# Patient Record
Sex: Female | Born: 1935 | Race: Black or African American | Hispanic: No | Marital: Single | Smoking: Never smoker
Health system: Southern US, Community
[De-identification: ages and names within clinical notes are randomized; demographics above are authoritative.]

## PROBLEM LIST (undated history)

## (undated) DIAGNOSIS — I10 Essential (primary) hypertension: Secondary | ICD-10-CM

## (undated) DIAGNOSIS — J449 Chronic obstructive pulmonary disease, unspecified: Secondary | ICD-10-CM

## (undated) DIAGNOSIS — K5790 Diverticulosis of intestine, part unspecified, without perforation or abscess without bleeding: Secondary | ICD-10-CM

## (undated) DIAGNOSIS — D869 Sarcoidosis, unspecified: Secondary | ICD-10-CM

## (undated) DIAGNOSIS — J45909 Unspecified asthma, uncomplicated: Secondary | ICD-10-CM

## (undated) HISTORY — PX: EYE SURGERY: SHX253

## (undated) HISTORY — PX: HERNIA REPAIR: SHX51

## (undated) HISTORY — PX: KNEE SURGERY: SHX244

---

## 2015-09-27 ENCOUNTER — Encounter: Payer: Self-pay | Admitting: Emergency Medicine

## 2015-09-27 ENCOUNTER — Observation Stay
Admission: EM | Admit: 2015-09-27 | Discharge: 2015-09-28 | Disposition: A | Discharge status: Home / Self Care | Payer: Medicare Other | Attending: Internal Medicine | Admitting: Internal Medicine

## 2015-09-27 DIAGNOSIS — R42 Dizziness and giddiness: Secondary | ICD-10-CM | POA: Diagnosis not present

## 2015-09-27 DIAGNOSIS — I495 Sick sinus syndrome: Secondary | ICD-10-CM | POA: Diagnosis present

## 2015-09-27 DIAGNOSIS — Z8589 Personal history of malignant neoplasm of other organs and systems: Secondary | ICD-10-CM | POA: Insufficient documentation

## 2015-09-27 DIAGNOSIS — I10 Essential (primary) hypertension: Secondary | ICD-10-CM | POA: Insufficient documentation

## 2015-09-27 DIAGNOSIS — K859 Acute pancreatitis, unspecified: Secondary | ICD-10-CM

## 2015-09-27 DIAGNOSIS — K429 Umbilical hernia without obstruction or gangrene: Secondary | ICD-10-CM | POA: Insufficient documentation

## 2015-09-27 DIAGNOSIS — Z9849 Cataract extraction status, unspecified eye: Secondary | ICD-10-CM | POA: Insufficient documentation

## 2015-09-27 DIAGNOSIS — Z9981 Dependence on supplemental oxygen: Secondary | ICD-10-CM | POA: Insufficient documentation

## 2015-09-27 DIAGNOSIS — N83201 Unspecified ovarian cyst, right side: Secondary | ICD-10-CM | POA: Insufficient documentation

## 2015-09-27 DIAGNOSIS — K5730 Diverticulosis of large intestine without perforation or abscess without bleeding: Secondary | ICD-10-CM | POA: Insufficient documentation

## 2015-09-27 DIAGNOSIS — J45909 Unspecified asthma, uncomplicated: Secondary | ICD-10-CM | POA: Diagnosis not present

## 2015-09-27 DIAGNOSIS — R000 Tachycardia, unspecified: Secondary | ICD-10-CM | POA: Diagnosis not present

## 2015-09-27 DIAGNOSIS — R55 Syncope and collapse: Secondary | ICD-10-CM | POA: Insufficient documentation

## 2015-09-27 DIAGNOSIS — R1084 Generalized abdominal pain: Secondary | ICD-10-CM | POA: Diagnosis present

## 2015-09-27 DIAGNOSIS — Z79899 Other long term (current) drug therapy: Secondary | ICD-10-CM | POA: Insufficient documentation

## 2015-09-27 DIAGNOSIS — R1032 Left lower quadrant pain: Secondary | ICD-10-CM | POA: Insufficient documentation

## 2015-09-27 DIAGNOSIS — Z7951 Long term (current) use of inhaled steroids: Secondary | ICD-10-CM | POA: Diagnosis not present

## 2015-09-27 DIAGNOSIS — Z780 Asymptomatic menopausal state: Secondary | ICD-10-CM | POA: Diagnosis not present

## 2015-09-27 DIAGNOSIS — I493 Ventricular premature depolarization: Secondary | ICD-10-CM | POA: Insufficient documentation

## 2015-09-27 DIAGNOSIS — R109 Unspecified abdominal pain: Secondary | ICD-10-CM | POA: Diagnosis present

## 2015-09-27 DIAGNOSIS — J449 Chronic obstructive pulmonary disease, unspecified: Secondary | ICD-10-CM | POA: Diagnosis not present

## 2015-09-27 DIAGNOSIS — N83209 Unspecified ovarian cyst, unspecified side: Secondary | ICD-10-CM

## 2015-09-27 HISTORY — DX: Chronic obstructive pulmonary disease, unspecified: J44.9

## 2015-09-27 HISTORY — DX: Diverticulosis of intestine, part unspecified, without perforation or abscess without bleeding: K57.90

## 2015-09-27 HISTORY — DX: Unspecified asthma, uncomplicated: J45.909

## 2015-09-27 HISTORY — DX: Essential (primary) hypertension: I10

## 2015-09-27 HISTORY — DX: Sarcoidosis, unspecified: D86.9

## 2015-09-27 LAB — CBC
HCT: 33.6 % — ABNORMAL LOW (ref 35.0–47.0)
HEMOGLOBIN: 10.9 g/dL — AB (ref 12.0–16.0)
MCH: 28.9 pg (ref 26.0–34.0)
MCHC: 32.5 g/dL (ref 32.0–36.0)
MCV: 88.9 fL (ref 80.0–100.0)
PLATELETS: 218 10*3/uL (ref 150–440)
RBC: 3.77 MIL/uL — AB (ref 3.80–5.20)
RDW: 15.2 % — ABNORMAL HIGH (ref 11.5–14.5)
WBC: 4.5 10*3/uL (ref 3.6–11.0)

## 2015-09-27 LAB — URINALYSIS COMPLETE WITH MICROSCOPIC (ARMC ONLY)
BILIRUBIN URINE: NEGATIVE
Bacteria, UA: NONE SEEN
GLUCOSE, UA: NEGATIVE mg/dL
HGB URINE DIPSTICK: NEGATIVE
KETONES UR: NEGATIVE mg/dL
Leukocytes, UA: NEGATIVE
NITRITE: NEGATIVE
Protein, ur: NEGATIVE mg/dL
Specific Gravity, Urine: 1.014 (ref 1.005–1.030)
pH: 6 (ref 5.0–8.0)

## 2015-09-27 LAB — BASIC METABOLIC PANEL
ANION GAP: 8 (ref 5–15)
BUN: 14 mg/dL (ref 6–20)
CALCIUM: 9.8 mg/dL (ref 8.9–10.3)
CO2: 33 mmol/L — AB (ref 22–32)
CREATININE: 1.03 mg/dL — AB (ref 0.44–1.00)
Chloride: 101 mmol/L (ref 101–111)
GFR, EST AFRICAN AMERICAN: 56 mL/min — AB (ref >60)
GFR, EST NON AFRICAN AMERICAN: 48 mL/min — AB (ref >60)
Glucose, Bld: 145 mg/dL — ABNORMAL HIGH (ref 65–99)
Potassium: 3.9 mmol/L (ref 3.5–5.1)
SODIUM: 142 mmol/L (ref 135–145)

## 2015-09-27 LAB — TROPONIN I: Troponin I: 0.03 ng/mL (ref <0.031)

## 2015-09-27 MED ORDER — IOHEXOL 240 MG/ML SOLN
25.0000 mL | Freq: Once | INTRAMUSCULAR | Status: AC | PRN
  Administered 2015-09-27: 25 mL via ORAL

## 2015-09-27 MED ORDER — SODIUM CHLORIDE 0.9 % IV BOLUS (SEPSIS)
1000.0000 mL | Freq: Once | INTRAVENOUS | Status: AC
  Administered 2015-09-28: 1000 mL via INTRAVENOUS

## 2015-09-27 NOTE — ED Notes (Addendum)
Patient states that she felt dizzy, diaphoretic and felt like she was going to pass out. Patient states that she vomited times one. Patient reports some mid abd pain.

## 2015-09-27 NOTE — ED Provider Notes (Signed)
[facility] Emergency Department Provider Note  ____________________________________________  Time seen: Approximately 11:36 PM  I have reviewed the triage vital signs and the nursing notes.   HISTORY  Chief Complaint Near Syncope    HPI Kayla Macias is a 82 y.o. female who presents to the ED via EMS from [facility] basketball game with a chief complain of abdominal pain and near syncope.Patient has a history of COPD on chronic oxygen, hypertension, sarcoidosis not on steroids, history of diverticulitis s/p surgery several years ago. While watching the basketball game, patient felt pain above her umbilicus followed by sensation of lightheadedness, sweating and vomiting x one. Daughter took her to the restroom and she had a normal bowel movement which did alleviate her abdominal pain somewhat. Patient just arrived this morning from [place] via airplane, visiting family. Denies recent fever, chills, chest pain, more short of breath than usual, dysuria. Denies recent trauma.   Past Medical History  Diagnosis Date  . COPD (chronic obstructive pulmonary disease) (HCC)   . Hypertension   . Sarcoidosis (HCC)   . Asthma     There are no active problems to display for this patient.   Past Surgical History  Procedure Laterality Date  . Hernia repair    . Eye surgery      cataract surgery    Current Outpatient Rx  Name  Route  Sig  Dispense  Refill  . ADVAIR DISKUS 250-50 MCG/DOSE AEPB   Inhalation   Inhale 1 puff into the lungs daily.           Dispense as written.   Marland Kitchen atorvastatin (LIPITOR) 10 MG tablet   Oral   Take 10 mg by mouth daily.         . fosinopril-hydrochlorothiazide (MONOPRIL-HCT) 10-12.5 MG tablet   Oral   Take 1 tablet by mouth daily.         Marland Kitchen LUMIGAN 0.01 % SOLN   Both Eyes   Place 1 drop into both eyes every evening.           Dispense as written.   . mometasone (ELOCON) 0.1 % ointment   Topical   Apply 1 application  topically 2 (two) times daily as needed.         Marland Kitchen omeprazole (PRILOSEC) 20 MG capsule   Oral   Take 20 mg by mouth daily.           Allergies Review of patient's allergies indicates no known allergies.  No family history on file.  Social History Social History  Substance Use Topics  . Smoking status: Never Smoker   . Smokeless tobacco: None  . Alcohol Use: Yes     Comment: occasinally    Review of Systems Constitutional: No fever/chills. Eyes: No visual changes. ENT: No sore throat. Cardiovascular: Denies chest pain. Respiratory: Denies shortness of breath. Gastrointestinal: Positive for abdominal pain.  Positive for nausea and vomiting x 1.  No diarrhea.  No constipation. Genitourinary: Negative for dysuria. Musculoskeletal: Negative for back pain. Skin: Negative for rash. Neurological: Negative for headaches, focal weakness or numbness.  10-point ROS otherwise negative.  ____________________________________________   PHYSICAL EXAM:  VITAL SIGNS: ED Triage Vitals  Enc Vitals Group     BP 09/27/15 2108 112/60 mmHg     Pulse Rate 09/27/15 2108 84     Resp 09/27/15 2108 15     Temp 09/27/15 2108 98.5 F (36.9 C)     Temp Source 09/27/15 2108 Oral  SpO2 09/27/15 2108 94 %     Weight 09/27/15 2108 165 lb (74.844 kg)     Height 09/27/15 2108 3' (0.914 m)     Head Cir --      Peak Flow --      Pain Score 09/27/15 2117 2     Pain Loc --      Pain Edu? --      Excl. in GC? --     Constitutional: Alert and oriented. Well appearing and in no acute distress. Eyes: Conjunctivae are normal. PERRL. EOMI. Head: Atraumatic. Nose: No congestion/rhinnorhea. Mouth/Throat: Mucous membranes are moist.  Oropharynx non-erythematous. Neck: No stridor.  No carotid bruits. Cardiovascular: Normal rate, regular rhythm. Grossly normal heart sounds.  Good peripheral circulation. Respiratory: Normal respiratory effort.  No retractions. Lungs CTAB. Gastrointestinal: Soft  and tender to palpation diffusely, maximally in left lower quadrant without rebound or guarding. No distention. No abdominal bruits. No CVA tenderness. Musculoskeletal: No lower extremity tenderness nor edema.  No joint effusions. Neurologic:  Normal speech and language. No gross focal neurologic deficits are appreciated. No gait instability. Skin:  Skin is warm, dry and intact. No rash noted. Psychiatric: Mood and affect are normal. Speech and behavior are normal.  ____________________________________________   LABS (all labs ordered are listed, but only abnormal results are displayed)  Labs Reviewed  BASIC METABOLIC PANEL - Abnormal; Notable for the following:    CO2 33 (*)    Glucose, Bld 145 (*)    Creatinine, Ser 1.03 (*)    GFR calc non Af Amer 48 (*)    GFR calc Af Amer 56 (*)    All other components within normal limits  CBC - Abnormal; Notable for the following:    RBC 3.77 (*)    Hemoglobin 10.9 (*)    HCT 33.6 (*)    RDW 15.2 (*)    All other components within normal limits  URINALYSIS COMPLETEWITH MICROSCOPIC ([facility] ONLY) - Abnormal; Notable for the following:    Color, Urine YELLOW (*)    APPearance CLEAR (*)    Squamous Epithelial / LPF 0-5 (*)    All other components within normal limits  TROPONIN I  TROPONIN I  HEPATIC FUNCTION PANEL  LIPASE, BLOOD   ____________________________________________  EKG  ED ECG REPORT I, SUNG,JADE J, the attending physician, personally viewed and interpreted this ECG.   Date: 09/28/2015  EKG Time: 2123  Rate: 83  Rhythm: normal EKG, normal sinus rhythm  Axis: Normal  Intervals:none  ST&T Change: Nonspecific  ____________________________________________  RADIOLOGY  CT abdomen/pelvis interpreted per Dr. Manus Gunning: 1. Equivocal soft tissue stranding about the pancreatic head. This may reflect mild pancreatitis. Recommend correlation with pancreatic enzymes. 2. Extensive colonic diverticulosis throughout the entire  colon, without colonic or diverticular inflammation. 3. Incidental findings include aneurysmal dilatation of the left common iliac artery of 3.3 cm, no aortic aneurysm. Correlation with any prior imaging would be most helpful to confirm stability. 4. Right ovarian cyst measures 2.4 cm, abnormal in a postmenopausal patient. Again, correlation with any prior imaging would be most helpful. In the absence of prior imaging, nonemergent pelvic ultrasound characterization recommended. 5. Gallstones versus gallbladder sludge, no pericholecystic inflammation. ____________________________________________   PROCEDURES  Procedure(s) performed: None  Critical Care performed: No  ____________________________________________   INITIAL IMPRESSION / ASSESSMENT AND PLAN / ED COURSE  Pertinent labs & imaging results that were available during my care of the patient were reviewed by me and considered in my medical decision  making (see chart for details).  82 year old female who presents with abdominal pain and near syncopal episode. Initial laboratory and urinalysis results unremarkable. Patient is noted to alternate between normal heart rate in the 80s to 90s and sinus tachycardia as high as the 140s frequently without change in her clinical appearance. She is not anxious and is not hurting enough in her abdomen to request pain medicine. Will continue to monitor, obtain timed repeat troponin as well as CT abdomen/pelvis to evaluate etiology of patient's abdominal pain.  ----------------------------------------- 1:36 AM on 09/28/2015 -----------------------------------------  Updated patient and daughter of imaging results. Added on LFTs and lipase to labs already drawn. Patient continues to alternate between normal sinus and sinus tachycardia; concern for sick sinus syndrome. Discussed with hospitalist who will evaluate patient in the emergency department for  admission. ____________________________________________   FINAL CLINICAL IMPRESSION(S) / ED DIAGNOSES  Final diagnoses:  Acute pancreatitis, unspecified pancreatitis type  Sick sinus syndrome (HCC)  Near syncope  Generalized abdominal pain      Irean Hong, MD 09/28/15 [id number]

## 2015-09-27 NOTE — ED Notes (Signed)
FIRST NURSE NOTE: Patient presents to ED via EMS with c/o abdominal pain just superior to her umbilicus. Patient had some nausea. Reports having a BM and feeling "better". Daughter insisted that patient come to the ED via EMS citing that she would be seen faster that way.

## 2015-09-28 ENCOUNTER — Encounter: Payer: Self-pay | Admitting: Radiology

## 2015-09-28 ENCOUNTER — Emergency Department: Payer: Medicare Other

## 2015-09-28 DIAGNOSIS — R1084 Generalized abdominal pain: Secondary | ICD-10-CM | POA: Diagnosis not present

## 2015-09-28 DIAGNOSIS — R109 Unspecified abdominal pain: Secondary | ICD-10-CM | POA: Diagnosis present

## 2015-09-28 LAB — LIPASE, BLOOD: Lipase: 22 U/L (ref 11–51)

## 2015-09-28 LAB — HEPATIC FUNCTION PANEL
ALT: 13 U/L — AB (ref 14–54)
AST: 25 U/L (ref 15–41)
Albumin: 3.9 g/dL (ref 3.5–5.0)
Alkaline Phosphatase: 65 U/L (ref 38–126)
BILIRUBIN TOTAL: 0.5 mg/dL (ref 0.3–1.2)
Total Protein: 7.2 g/dL (ref 6.5–8.1)

## 2015-09-28 LAB — TSH: TSH: 1.772 u[IU]/mL (ref 0.350–4.500)

## 2015-09-28 LAB — TROPONIN I: Troponin I: 0.04 ng/mL — ABNORMAL HIGH (ref <0.031)

## 2015-09-28 MED ORDER — IOHEXOL 300 MG/ML  SOLN
100.0000 mL | Freq: Once | INTRAMUSCULAR | Status: AC | PRN
  Administered 2015-09-28: 100 mL via INTRAVENOUS

## 2015-09-28 MED ORDER — OXYCODONE HCL 5 MG PO TABS: 5.0000 mg | ORAL_TABLET | ORAL | Status: DC | PRN

## 2015-09-28 MED ORDER — ONDANSETRON HCL 4 MG/2ML IJ SOLN: 4.0000 mg | Freq: Four times a day (QID) | INTRAMUSCULAR | Status: DC | PRN

## 2015-09-28 MED ORDER — HYDROCHLOROTHIAZIDE 12.5 MG PO CAPS
12.5000 mg | ORAL_CAPSULE | Freq: Every day | ORAL | Status: DC
  Administered 2015-09-28: 12.5 mg via ORAL
  Filled 2015-09-28: qty 1

## 2015-09-28 MED ORDER — ATORVASTATIN CALCIUM 10 MG PO TABS
10.0000 mg | ORAL_TABLET | Freq: Every day | ORAL | Status: DC
  Administered 2015-09-28: 10 mg via ORAL
  Filled 2015-09-28: qty 1

## 2015-09-28 MED ORDER — PANTOPRAZOLE SODIUM 40 MG PO TBEC
40.0000 mg | DELAYED_RELEASE_TABLET | Freq: Every day | ORAL | Status: DC
  Administered 2015-09-28: 40 mg via ORAL
  Filled 2015-09-28: qty 1

## 2015-09-28 MED ORDER — ONDANSETRON HCL 4 MG PO TABS: 4.0000 mg | ORAL_TABLET | Freq: Four times a day (QID) | ORAL | Status: DC | PRN

## 2015-09-28 MED ORDER — METOPROLOL SUCCINATE ER 25 MG PO TB24
25.0000 mg | ORAL_TABLET | Freq: Every day | ORAL | Status: DC
  Administered 2015-09-28: 25 mg via ORAL
  Filled 2015-09-28: qty 1

## 2015-09-28 MED ORDER — POLYETHYLENE GLYCOL 3350 17 G PO PACK: 17.0000 g | PACK | Freq: Every day | ORAL | Status: DC | PRN

## 2015-09-28 MED ORDER — MOMETASONE FURO-FORMOTEROL FUM 200-5 MCG/ACT IN AERO
2.0000 | INHALATION_SPRAY | Freq: Two times a day (BID) | RESPIRATORY_TRACT | Status: DC
  Administered 2015-09-28: 2 via RESPIRATORY_TRACT
  Filled 2015-09-28: qty 8.8

## 2015-09-28 MED ORDER — LISINOPRIL 10 MG PO TABS
10.0000 mg | ORAL_TABLET | Freq: Every day | ORAL | Status: DC
  Administered 2015-09-28: 10 mg via ORAL
  Filled 2015-09-28: qty 1

## 2015-09-28 MED ORDER — SODIUM CHLORIDE 0.9% FLUSH
3.0000 mL | Freq: Two times a day (BID) | INTRAVENOUS | Status: DC
  Administered 2015-09-28 (×2): 3 mL via INTRAVENOUS

## 2015-09-28 MED ORDER — ACETAMINOPHEN 325 MG PO TABS: 650.0000 mg | ORAL_TABLET | Freq: Four times a day (QID) | ORAL | Status: DC | PRN

## 2015-09-28 MED ORDER — ACETAMINOPHEN 650 MG RE SUPP: 650.0000 mg | Freq: Four times a day (QID) | RECTAL | Status: DC | PRN

## 2015-09-28 MED ORDER — BISACODYL 10 MG RE SUPP: 10.0000 mg | Freq: Every day | RECTAL | Status: DC | PRN

## 2015-09-28 MED ORDER — SODIUM CHLORIDE 0.9 % IV BOLUS (SEPSIS): 1000.0000 mL | Freq: Once | INTRAVENOUS | Status: DC

## 2015-09-28 MED ORDER — FOSINOPRIL SODIUM-HCTZ 10-12.5 MG PO TABS: 1.0000 | ORAL_TABLET | Freq: Every day | ORAL | Status: DC

## 2015-09-28 NOTE — ED Notes (Signed)
Pt reports one day of periumbilical abdominal pain.

## 2015-09-28 NOTE — Discharge Instructions (Signed)
Premature Ventricular Contraction A premature ventricular contraction is an irregularity in the normal heart rhythm. These contractions are extra heartbeats that occur too early in the normal sequence. In most cases, these contractions are harmless and do not require treatment. CAUSES Premature ventricular contractions may occur without a known cause. In healthy people, the extra contractions may be caused by:  Smoking.  Drinking alcohol.  Caffeine.  Certain medicines.  Some illegal drugs.  Stress. Sometimes, changes in chemicals in the blood (electrolytes) can also cause premature ventricular contractions. They can also occur in people with heart diseases that cause a decrease in blood flow to the heart. SIGNS AND SYMPTOMS Premature ventricular contractions often do not cause any symptoms. In some cases, you may have a feeling of your heart beating fast or skipping a beat (palpitations). DIAGNOSIS Your health care provider will take your medical history and do a physical exam. During the exam, the health care provider will check for irregular heartbeats. Various tests may be done to help diagnose premature ventricular contractions. These tests may include:  An ECG (electrocardiogram) to monitor the electrical activity of your heart.  Holter monitor testing. A Holter monitor is a portable device that can monitor the electrical activity of your heart over longer periods of time.  Stress tests to see how exercise affects your heart rhythm.  Echocardiogram. This test uses sound waves (ultrasound) to produce an image of your heart.  Electrophysiology study. This is used to evaluate the electrical conduction system of your heart. TREATMENT Usually, no treatment is needed. You may be advised to avoid things that can trigger the premature contractions, such as caffeine or alcohol. Medicines are sometimes given if symptoms are severe or if the extra heartbeats are very frequent. Treatment may  also be needed for an underlying cause of the contractions if one is found. HOME CARE INSTRUCTIONS  Take medicines only as directed by your health care provider.  Make any lifestyle changes recommended by your health care provider. These may include:  Quitting smoking.  Avoiding or limiting caffeine or alcohol.  Exercising. Talk to your health care provider about what type of exercise is safe for you.  Trying to reduce stress.  Keep all follow-up visits with your health care provider. This is important. SEEK IMMEDIATE MEDICAL CARE IF:  You feel palpitations that are frequent or continual.  You have chest pain.  You have shortness of breath.  You have sweating for no reason.  You have nausea and vomiting.  You become light-headed or faint.   This information is not intended to replace advice given to you by your health care provider. Make sure you discuss any questions you have with your health care provider.   Document Released: 03/07/2004 Document Revised: 08/11/2014 Document Reviewed: 12/22/2013 Elsevier Interactive Patient Education 2016 Elsevier Inc.  

## 2015-09-28 NOTE — Care Management Obs Status (Signed)
MEDICARE OBSERVATION STATUS NOTIFICATION   Patient Details  Name: Kayla Macias MRN: [id number] Date of Birth: 1928-02-11   Medicare Observation Status Notification Given:  No   Patient was discharged before CM could deliver.  Her stay was less than 12 hours     Eber Hong, [place] 09/28/2015, 6:18 PM

## 2015-09-28 NOTE — H&P (Signed)
[facility]   PATIENT NAME: Kayla Macias    MR#:  [id number]  DATE OF BIRTH:  Apr 22, 1928  DATE OF ADMISSION:  09/27/2015  PRIMARY CARE PHYSICIAN: No primary care provider on file.   REQUESTING/REFERRING PHYSICIAN: Dr. Dolores Frame  CHIEF COMPLAINT:   Chief Complaint  Patient presents with  . Near Syncope    HISTORY OF PRESENT ILLNESS:  Kayla Macias  is a 82 y.o. female with a known history of hypertension, diverticulitis, COPD, alcohol abuse. Recently presents to the emergency room due to abdominal pain. Patient is here in [place] to watch a basketball game from [place] along with her daughter who is a Recruitment consultant in [place]. Patient had acute pain in her abdomen with one episode of vomiting. She did have a normal bowel movement. Patient was brought to the emergency room and on the CT scan was found to have pancreatitis. Gallstones present in the gallbladder. Patient has had on and off similar abdominal pain for 2 months now. Had a CT scan of abdomen done 2 weeks back with her doctor back in [place] and is not aware of the results yet.  She used to drink 3-4 alcoholic drinks every day till Christmas and then stopped it.  In the emergency room she has been noticed to have fluctuating sinus tachycardia into the 130s without any palpitations or lightheadedness.  PAST MEDICAL HISTORY:   Past Medical History  Diagnosis Date  . COPD (chronic obstructive pulmonary disease) (HCC)   . Hypertension   . Sarcoidosis (HCC)   . Asthma   . Diverticulosis     PAST SURGICAL HISTORY:   Past Surgical History  Procedure Laterality Date  . Hernia repair    . Eye surgery      cataract surgery    SOCIAL HISTORY:   Social History  Substance Use Topics  . Smoking status: Never Smoker   . Smokeless tobacco: Not on file  . Alcohol Use: Yes     Comment: occasinally    FAMILY HISTORY:  No family history on file.  DRUG ALLERGIES:  No  Known Allergies  REVIEW OF SYSTEMS:   Review of Systems  Constitutional: Positive for malaise/fatigue. Negative for fever and chills.  HENT: Negative for sore throat.   Eyes: Negative for blurred vision, double vision and pain.  Respiratory: Negative for cough, hemoptysis, shortness of breath and wheezing.   Cardiovascular: Negative for chest pain, palpitations, orthopnea and leg swelling.  Gastrointestinal: Positive for nausea, vomiting and abdominal pain. Negative for heartburn, diarrhea and constipation.  Genitourinary: Negative for dysuria and hematuria.  Musculoskeletal: Negative for back pain and joint pain.  Skin: Negative for rash.  Neurological: Negative for sensory change, speech change, focal weakness and headaches.  Endo/Heme/Allergies: Does not bruise/bleed easily.  Psychiatric/Behavioral: Negative for depression. The patient is not nervous/anxious.     MEDICATIONS AT HOME:   Prior to Admission medications   Medication Sig Start Date End Date Taking? Authorizing Provider  ADVAIR DISKUS 250-50 MCG/DOSE AEPB Inhale 1 puff into the lungs daily.   Yes Historical Provider, MD  atorvastatin (LIPITOR) 10 MG tablet Take 10 mg by mouth daily.   Yes Historical Provider, MD  fosinopril-hydrochlorothiazide (MONOPRIL-HCT) 10-12.5 MG tablet Take 1 tablet by mouth daily.   Yes Historical Provider, MD  LUMIGAN 0.01 % SOLN Place 1 drop into both eyes every evening.   Yes Historical Provider, MD  mometasone (ELOCON) 0.1 % ointment Apply 1 application topically 2 (two)  times daily as needed.   Yes Historical Provider, MD  omeprazole (PRILOSEC) 20 MG capsule Take 20 mg by mouth daily.   Yes Historical Provider, MD     VITAL SIGNS:  Blood pressure 112/60, pulse 84, temperature 98.5 F (36.9 C), temperature source Oral, resp. rate 15, height 3' (0.914 m), weight 74.844 kg (165 lb), SpO2 94 %.  PHYSICAL EXAMINATION:  Physical Exam  GENERAL:  82 y.o.-year-old patient lying in the bed with  no acute distress.  EYES: Pupils equal, round, reactive to light and accommodation. No scleral icterus. Extraocular muscles intact.  HEENT: Head atraumatic, normocephalic. Oropharynx and nasopharynx clear. No oropharyngeal erythema, moist oral mucosa  NECK:  Supple, no jugular venous distention. No thyroid enlargement, no tenderness.  LUNGS: Normal breath sounds bilaterally, no wheezing, rales, rhonchi. No use of accessory muscles of respiration.  CARDIOVASCULAR: S1, S2 normal. No murmurs, rubs, or gallops. Tachycardia ABDOMEN: Soft, nontender, nondistended. Bowel sounds present. No organomegaly or mass.  EXTREMITIES: No pedal edema, cyanosis, or clubbing. + 2 pedal & radial pulses b/l.   NEUROLOGIC: Cranial nerves II through XII are intact. No focal Motor or sensory deficits appreciated b/l PSYCHIATRIC: The patient is alert and oriented x 3. Good affect.  SKIN: No obvious rash, lesion, or ulcer.   LABORATORY PANEL:   CBC  Recent Labs Lab 09/27/15 2121  WBC 4.5  HGB 10.9*  HCT 33.6*  PLT 218   ------------------------------------------------------------------------------------------------------------------  Chemistries   Recent Labs Lab 09/27/15 2121  NA 142  K 3.9  CL 101  CO2 33*  GLUCOSE 145*  BUN 14  CREATININE 1.03*  CALCIUM 9.8   ------------------------------------------------------------------------------------------------------------------  Cardiac Enzymes  Recent Labs Lab 09/27/15 2121  TROPONINI 0.03   ------------------------------------------------------------------------------------------------------------------  RADIOLOGY:  Ct Abdomen Pelvis W Contrast  09/28/2015  CLINICAL DATA:  Left lower quadrant abdominal pain. History of diverticulitis. EXAM: CT ABDOMEN AND PELVIS WITH CONTRAST TECHNIQUE: Multidetector CT imaging of the abdomen and pelvis was performed using the standard protocol following bolus administration of intravenous contrast.  CONTRAST:  OMNIPAQUE IOHEXOL 300 MG/ML  SOLN COMPARISON:  None. FINDINGS: Lower chest: The heart is enlarged. No consolidation or pleural effusion. Liver: Small Y-shaped hypodensity in left lobe, may be small adjacent cyst or hemangioma. No suspicious characteristics. Hepatobiliary: Gallbladder physiologically distended. Minimal layering hyperdensity, may be small stones or sludge. No biliary dilatation. Pancreas: Equivocal soft tissue stranding about the pancreatic head. Mild ductal prominence in the midbody, likely incidental. Spleen: Normal. Adrenal glands: No nodule. Kidneys: Symmetric renal enhancement and excretion. No hydronephrosis. Small cysts in both kidneys. Stomach/Bowel: Stomach is mildly distended by ingested contrast. There are no dilated or thickened small bowel loops. Extensive diverticulosis throughout the entire colon. Enteric chain sutures noted in the mid transverse colon. Small umbilical hernia containing nonobstructed bowel. No acute diverticulitis. The appendix is not confidently identified. Vascular/Lymphatic: No retroperitoneal adenopathy. Abdominal aorta is normal in caliber. There is marked tortuosity of the bilateral common iliac arteries. 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 Right common iliac artery measures 1.6 cm. Reproductive: Uterus remains in situ. There is a 2.4 cm right adnexal cyst. Left ovary quiescent. Bladder: Physiologically distended, no wall thickening. Other: No free air, free fluid, or intra-abdominal fluid collection. Musculoskeletal: There are no acute or suspicious osseous abnormalities. Advanced degenerative change in the spine. IMPRESSION: 1. Equivocal soft tissue stranding about the pancreatic head. This may reflect mild pancreatitis. Recommend correlation with pancreatic enzymes. 2. Extensive colonic diverticulosis  throughout the entire colon, without colonic or diverticular inflammation. 3. Incidental findings  include aneurysmal dilatation of the left common iliac artery of 3.3 cm, no aortic aneurysm. Correlation with any prior imaging would be most helpful to confirm stability. 4. Right ovarian cyst measures 2.4 cm, abnormal in a postmenopausal patient. Again, correlation with any prior imaging would be most helpful. In the absence of prior imaging, nonemergent pelvic ultrasound characterization recommended. 5. Gallstones versus gallbladder sludge, no pericholecystic inflammation. Electronically Signed   By: Rubye Oaks M.D.   On: 09/28/2015 01:10     IMPRESSION AND PLAN:   *  Acute pancreatitis CT scan of the abdomen shows fullness of the head of pancreas. Lipase is pending. Patient likely has had this on and off for 2 months. Advance diet as tolerated. Pain medications as needed. Symptomatic management of nausea.  * Sinus tachycardia Patient has had fluctuating sinus tachycardia up to 130s. No arrhythmias. No history of palpitations. Will start low-dose metoprolol. IV fluid bolus. Could be related to pain or pancreatitis.  if no improvement may need to consult cardiology.  * COPD with nighttime use oxygen Stable   * Hypertension Continue home medications  * DVT prophylaxis with SCDs.   All the records are reviewed and case discussed with ED provider. Management plans discussed with the patient, family and they are in agreement.  CODE STATUS: FULL  TOTAL TIME TAKING CARE OF THIS PATIENT: 40 minutes.   Milagros Loll R M.D on 09/28/2015 at 2:11 AM  Between 7am to 6pm - Pager - [phone]  After 6pm go to [email or web address] - password [email or web address] [facility]  [place] [facility]  Office  [phone]  CC: Primary care physician; No primary care provider on file.  Note: This dictation was prepared with Dragon dictation along with smaller phrase technology. Any transcriptional errors that result from this process are unintentional.

## 2015-09-28 NOTE — Progress Notes (Signed)
Patient received discharge instructions, pt verbalized understanding. IV was removed with no signs of infection. Dressing clean, dry intact. No skin tears or wounds present. Patient was escorted out with staff member via wheelchair via private auto. No further needs from care management team.  

## 2015-09-28 NOTE — ED Notes (Signed)
Lab called to report troponin of 0.04 Dr. Dolores Frame made aware

## 2015-09-28 NOTE — ED Notes (Signed)
Patient transported to CT 

## 2015-09-29 NOTE — Discharge Summary (Addendum)
[facility] - [place] at [facility]   PATIENT NAME: Kayla Macias    MR#:  [id number]  DATE OF BIRTH:  1927/08/30  DATE OF ADMISSION:  09/27/2015 ADMITTING PHYSICIAN: Milagros Loll, MD  DATE OF DISCHARGE: 09/28/2015 12:58 PM  PRIMARY CARE PHYSICIAN: Si Gaul MD   ADMISSION DIAGNOSIS:  Sick sinus syndrome (HCC) [I49.5] Generalized abdominal pain [R10.84] Near syncope [R55] Acute pancreatitis, unspecified pancreatitis type [K85.9]  DISCHARGE DIAGNOSIS:  Active Problems:   Abdominal pain PVC's  SECONDARY DIAGNOSIS:   Past Medical History  Diagnosis Date  . COPD (chronic obstructive pulmonary disease) (HCC)   . Hypertension   . Sarcoidosis (HCC)   . Asthma   . Diverticulosis     HOSPITAL COURSE:  82 y.o. female with a known history of hypertension, diverticulitis, COPD, alcohol abuse. Recently presents to the emergency room due to abdominal pain. Patient is here in [place] to watch a basketball game from [place] along with her daughter who is a Recruitment consultant in [place]. Patient had acute pain in her abdomen with one episode of vomiting. She did have a normal bowel movement. Patient was brought to the emergency room and on the CT scan was found to have possible mild pancreatitis although her lipase was normal so pancreatitis was ruled out. Please see Dr Eddie North dictated H & P for further details.  Her episode was thought to be either vaso vagal in nature or occasional symptomatic PVCs. After long d/w her daughter she was D/C home in stable condition. They were agreeable with D/C plans. DISCHARGE CONDITIONS:   STABLE  CONSULTS OBTAINED:     DRUG ALLERGIES:  No Known Allergies  DISCHARGE MEDICATIONS:   Discharge Medication List as of 09/28/2015 11:10 AM    CONTINUE these medications which have NOT CHANGED   Details  ADVAIR DISKUS 250-50 MCG/DOSE AEPB Inhale 1 puff into the lungs daily., Until Discontinued, Historical Med     atorvastatin (LIPITOR) 10 MG tablet Take 10 mg by mouth daily., Until Discontinued, Historical Med    fosinopril-hydrochlorothiazide (MONOPRIL-HCT) 10-12.5 MG tablet Take 1 tablet by mouth daily., Until Discontinued, Historical Med    LUMIGAN 0.01 % SOLN Place 1 drop into both eyes every evening., Until Discontinued, Historical Med    mometasone (ELOCON) 0.1 % ointment Apply 1 application topically 2 (two) times daily as needed., Until Discontinued, Historical Med    omeprazole (PRILOSEC) 20 MG capsule Take 20 mg by mouth daily., Until Discontinued, Historical Med         DISCHARGE INSTRUCTIONS:    DIET:  Regular diet  DISCHARGE CONDITION:  Good  ACTIVITY:  Activity as tolerated  OXYGEN:  Home Oxygen: No.   Oxygen Delivery: room air  DISCHARGE LOCATION:  home   If you experience worsening of your admission symptoms, develop shortness of breath, life threatening emergency, suicidal or homicidal thoughts you must seek medical attention immediately by calling 911 or calling your MD immediately  if symptoms less severe.  You Must read complete instructions/literature along with all the possible adverse reactions/side effects for all the Medicines you take and that have been prescribed to you. Take any new Medicines after you have completely understood and accpet all the possible adverse reactions/side effects.   Please note  You were cared for by a hospitalist during your hospital stay. If you have any questions about your discharge medications or the care you received while you were in the hospital after you are discharged, you can call the unit  and asked to speak with the hospitalist on call if the hospitalist that took care of you is not available. Once you are discharged, your primary care physician will handle any further medical issues. Please note that NO REFILLS for any discharge medications will be authorized once you are discharged, as it is imperative that you return  to your primary care physician (or establish a relationship with a primary care physician if you do not have one) for your aftercare needs so that they can reassess your need for medications and monitor your lab values.    On the day of Discharge:  VITAL SIGNS:  Blood pressure 134/73, pulse 69, temperature 97.8 F (36.6 C), temperature source Oral, resp. rate 20, height 3' (0.914 m), weight 68.539 kg (151 lb 1.6 oz), SpO2 93 %.  PHYSICAL EXAMINATION:  GENERAL:  82 y.o.-year-old patient lying in the bed with no acute distress.  EYES: Pupils equal, round, reactive to light and accommodation. No scleral icterus. Extraocular muscles intact.  HEENT: Head atraumatic, normocephalic. Oropharynx and nasopharynx clear.  NECK:  Supple, no jugular venous distention. No thyroid enlargement, no tenderness.  LUNGS: Normal breath sounds bilaterally, no wheezing, rales,rhonchi or crepitation. No use of accessory muscles of respiration.  CARDIOVASCULAR: S1, S2 normal. No murmurs, rubs, or gallops.  ABDOMEN: Soft, non-tender, non-distended. Bowel sounds present. No organomegaly or mass.  EXTREMITIES: No pedal edema, cyanosis, or clubbing.  NEUROLOGIC: Cranial nerves II through XII are intact. Muscle strength 5/5 in all extremities. Sensation intact. Gait not checked.  PSYCHIATRIC: The patient is alert and oriented x 3.  SKIN: No obvious rash, lesion, or ulcer.  DATA REVIEW:   CBC  Recent Labs Lab 09/27/15 2121  WBC 4.5  HGB 10.9*  HCT 33.6*  PLT 218    Chemistries   Recent Labs Lab 09/27/15 2121  NA 142  K 3.9  CL 101  CO2 33*  GLUCOSE 145*  BUN 14  CREATININE 1.03*  CALCIUM 9.8  AST 25  ALT 13*  ALKPHOS 65  BILITOT 0.5    Cardiac Enzymes  Recent Labs Lab 09/28/15 0123  TROPONINI 0.04*    Microbiology Results  No results found for this or any previous visit.  RADIOLOGY:  Ct Abdomen Pelvis W Contrast  09/28/2015  CLINICAL DATA:  Left lower quadrant abdominal pain.  History of diverticulitis. EXAM: CT ABDOMEN AND PELVIS WITH CONTRAST TECHNIQUE: Multidetector CT imaging of the abdomen and pelvis was performed using the standard protocol following bolus administration of intravenous contrast. CONTRAST:  OMNIPAQUE IOHEXOL 300 MG/ML  SOLN COMPARISON:  None. FINDINGS: Lower chest: The heart is enlarged. No consolidation or pleural effusion. Liver: Small Y-shaped hypodensity in left lobe, may be small adjacent cyst or hemangioma. No suspicious characteristics. Hepatobiliary: Gallbladder physiologically distended. Minimal layering hyperdensity, may be small stones or sludge. No biliary dilatation. Pancreas: Equivocal soft tissue stranding about the pancreatic head. Mild ductal prominence in the midbody, likely incidental. Spleen: Normal. Adrenal glands: No nodule. Kidneys: Symmetric renal enhancement and excretion. No hydronephrosis. Small cysts in both kidneys. Stomach/Bowel: Stomach is mildly distended by ingested contrast. There are no dilated or thickened small bowel loops. Extensive diverticulosis throughout the entire colon. Enteric chain sutures noted in the mid transverse colon. Small umbilical hernia containing nonobstructed bowel. No acute diverticulitis. The appendix is not confidently identified. Vascular/Lymphatic: No retroperitoneal adenopathy. Abdominal aorta is normal in caliber. There is marked tortuosity of the bilateral common iliac arteries. 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 Right common iliac artery measures 1.6 cm. Reproductive: Uterus remains in situ. There is a 2.4 cm right adnexal cyst. Left ovary quiescent. Bladder: Physiologically distended, no wall thickening. Other: No free air, free fluid, or intra-abdominal fluid collection. Musculoskeletal: There are no acute or suspicious osseous abnormalities. Advanced degenerative change in the spine. IMPRESSION: 1. Equivocal soft tissue stranding about the pancreatic  head. This may reflect mild pancreatitis. Recommend correlation with pancreatic enzymes. 2. Extensive colonic diverticulosis throughout the entire colon, without colonic or diverticular inflammation. 3. Incidental findings include aneurysmal dilatation of the left common iliac artery of 3.3 cm, no aortic aneurysm. Correlation with any prior imaging would be most helpful to confirm stability. 4. Right ovarian cyst measures 2.4 cm, abnormal in a postmenopausal patient. Again, correlation with any prior imaging would be most helpful. In the absence of prior imaging, nonemergent pelvic ultrasound characterization recommended. 5. Gallstones versus gallbladder sludge, no pericholecystic inflammation. Electronically Signed   By: Rubye Oaks M.D.   On: 09/28/2015 01:10     Management plans discussed with the patient, family and they are in agreement.  CODE STATUS:  Code Status History    Date Active Date Inactive Code Status Order ID Comments User Context   09/28/2015  2:06 AM 09/28/2015  3:58 PM Full Code [id number]  Milagros Loll, MD ED      TOTAL TIME TAKING CARE OF THIS PATIENT: 45 minutes.    [facility], Joslyne Marshburn M.D on 09/29/2015 at 4:00 PM  Between 7am to 6pm - Pager - [phone]  After 6pm go to [email or web address] - password [email or web address]  [facility]  Office  [phone]  CC: Primary care physician; Si Gaul MD   Note: This dictation was prepared with Dragon dictation along with smaller phrase technology. Any transcriptional errors that result from this process are unintentional.

## 2016-07-14 IMAGING — CT CT ABD-PELV W/ CM
1 of 4 series · 13 of 32 positions shown, 18 images · IV contrast (omnipaque)
Comparison: None.

CLINICAL DATA: Left lower quadrant abdominal pain. History of
diverticulitis.

EXAM:
CT ABDOMEN AND PELVIS WITH CONTRAST
TECHNIQUE: Multidetector CT imaging of the abdomen and pelvis was performed
using the standard protocol following bolus administration of
intravenous contrast.
CONTRAST:  100mL OMNIPAQUE IOHEXOL 300 MG/ML  SOLN

[Series 2: routine abd pel with · axial · 0.75mm/px · z∈[-822,-492]mm · 13 of 76 slices shown, 18 images]
[im 5/76  soft-tissue]
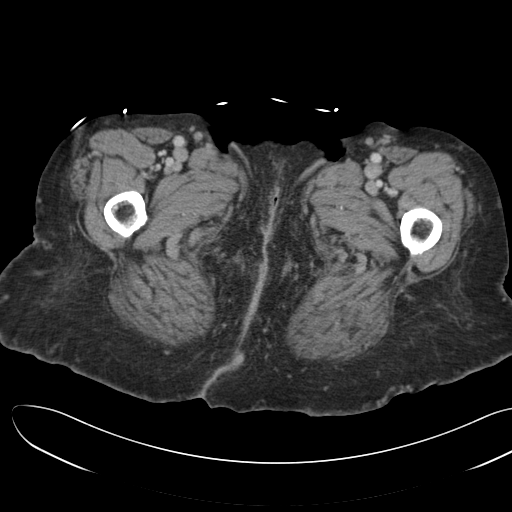
[im 5/76  bone]
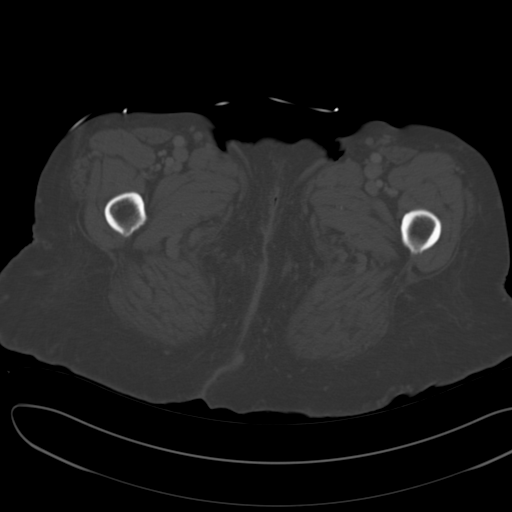
[im 10/76  soft-tissue]
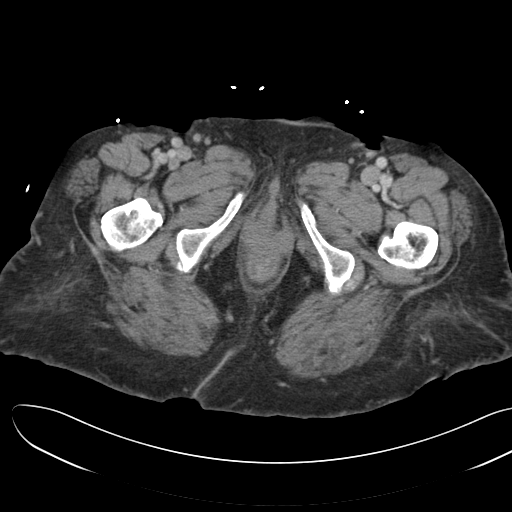
[im 19/76  soft-tissue]
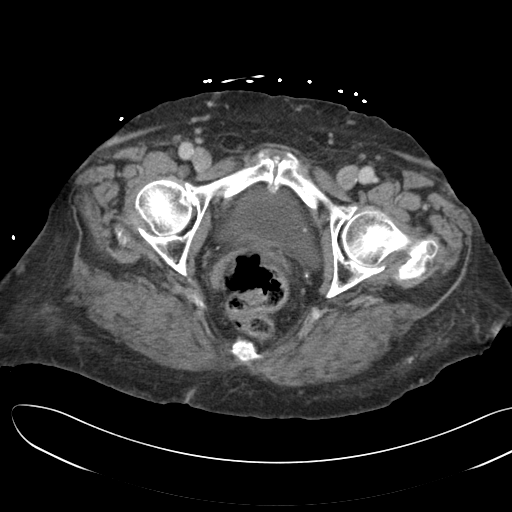
[im 24/76  soft-tissue]
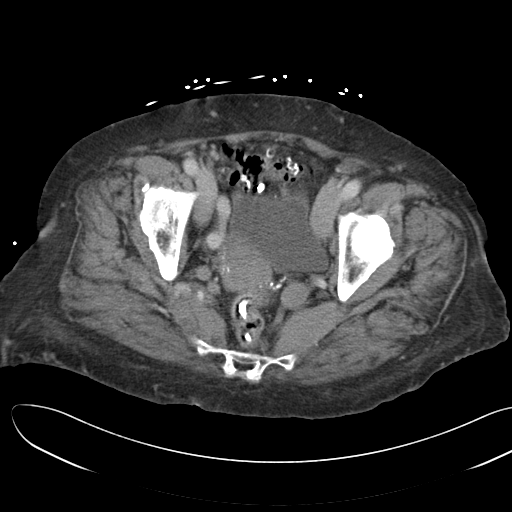
[im 29/76  soft-tissue]
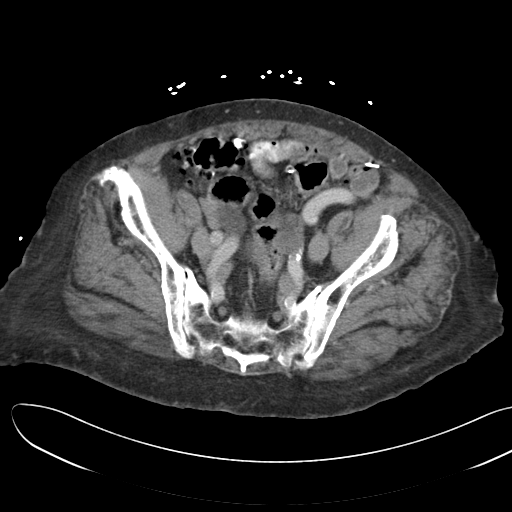
[im 33/76  soft-tissue]
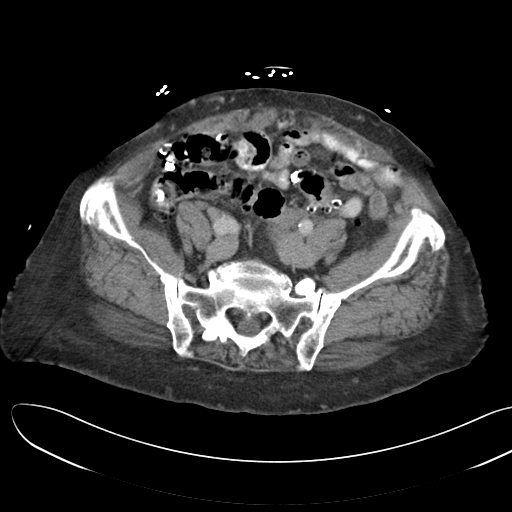
[im 43/76  soft-tissue]
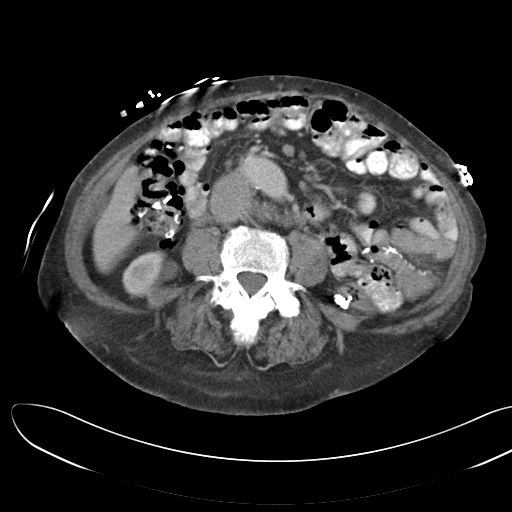
[im 47/76  soft-tissue]
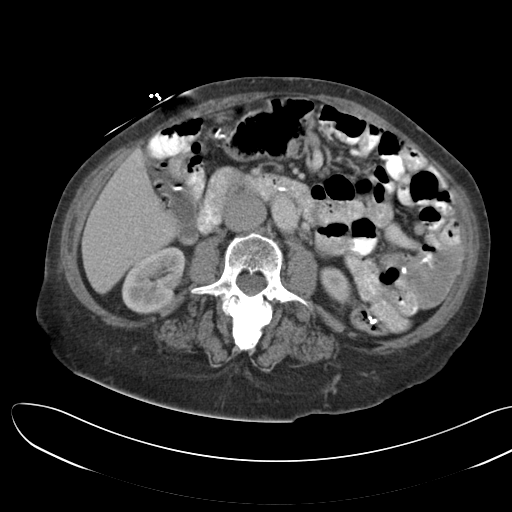
[im 52/76  soft-tissue]
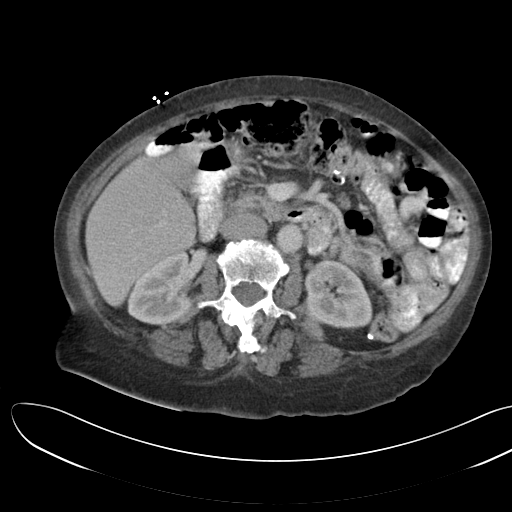
[im 52/76  bone]
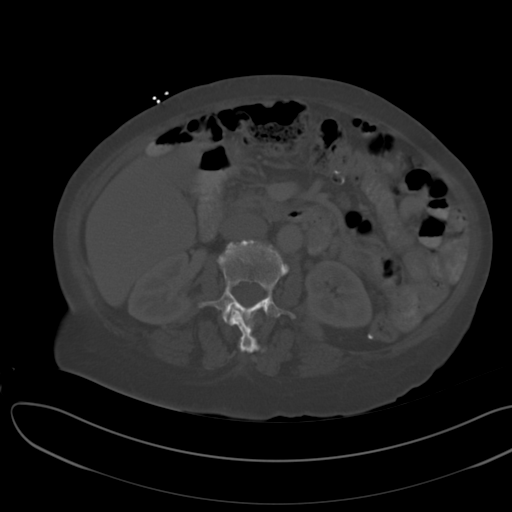
[im 57/76  soft-tissue]
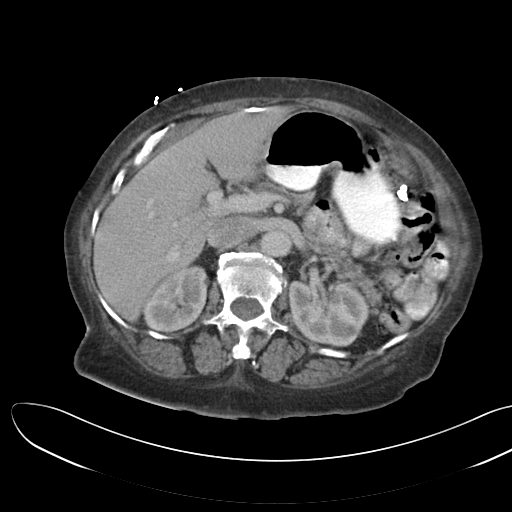
[im 57/76  lung]
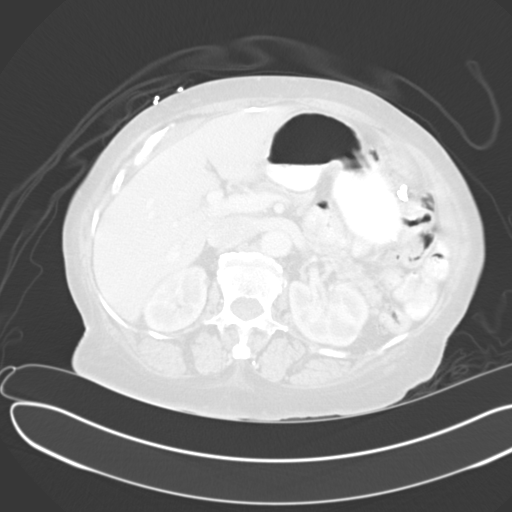
[im 61/76  lung]
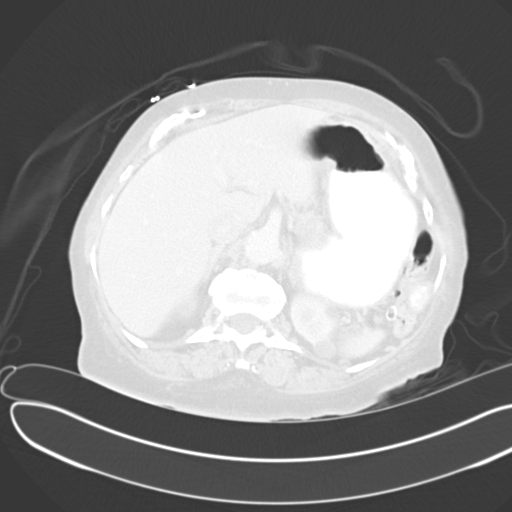
[im 66/76  soft-tissue]
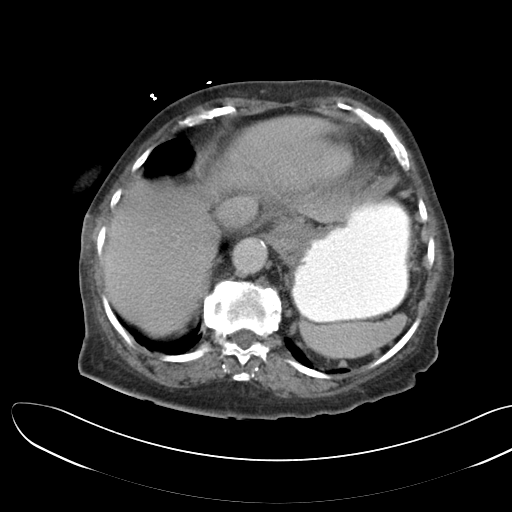
[im 66/76  lung]
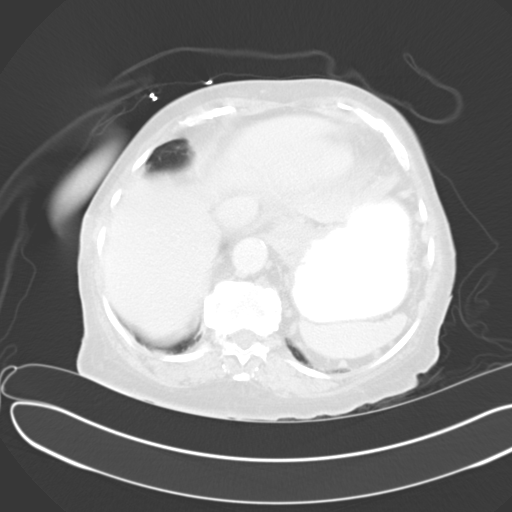
[im 71/76  soft-tissue]
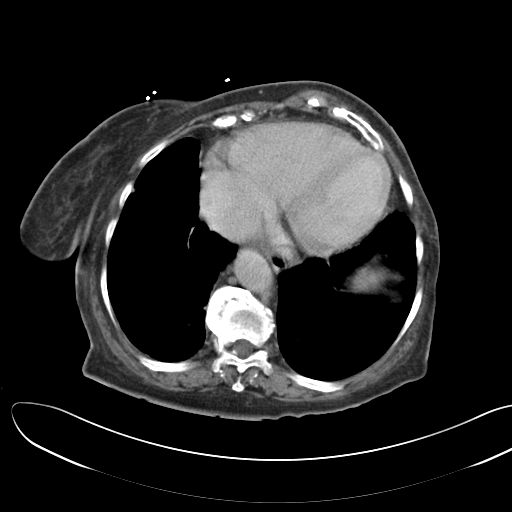
[im 71/76  lung]
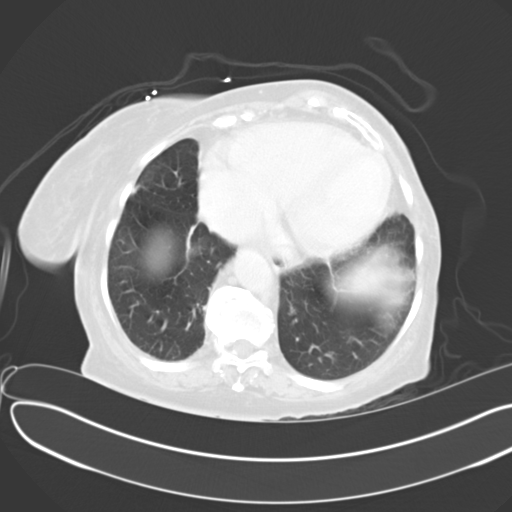

[13 of 32 positions shown; findings below may reference images not displayed]

FINDINGS: Lower chest: The heart is enlarged. No consolidation or pleural
effusion.

Liver: Small Y-shaped hypodensity in left lobe, may be small
adjacent cyst or hemangioma. No suspicious characteristics.

Hepatobiliary: Gallbladder physiologically distended. Minimal
layering hyperdensity, may be small stones or sludge. No biliary
dilatation.

Pancreas: Equivocal soft tissue stranding about the pancreatic head.
Mild ductal prominence in the midbody, likely incidental.

Spleen: Normal.

Adrenal glands: No nodule.

Kidneys: Symmetric renal enhancement and excretion. No
hydronephrosis. Small cysts in both kidneys.

Stomach/Bowel: Stomach is mildly distended by ingested contrast.
There are no dilated or thickened small bowel loops. Extensive
diverticulosis throughout the entire colon. Enteric chain sutures
noted in the mid transverse colon. Small umbilical hernia containing
nonobstructed bowel. No acute diverticulitis. The appendix is not
confidently identified.

Vascular/Lymphatic: No retroperitoneal adenopathy. Abdominal aorta
is normal in caliber. There is marked tortuosity of the bilateral
common iliac arteries. Aneurysmal dilatation of the left common
iliac artery measuring up to 3.3 cm. Right common iliac artery
measures 1.6 cm.

Reproductive: Uterus remains in situ. There is a 2.4 cm right
adnexal cyst. Left ovary quiescent.

Bladder: Physiologically distended, no wall thickening.

Other: No free air, free fluid, or intra-abdominal fluid collection.

Musculoskeletal: There are no acute or suspicious osseous
abnormalities. Advanced degenerative change in the spine.
IMPRESSION: 1. Equivocal soft tissue stranding about the pancreatic head. This
may reflect mild pancreatitis. Recommend correlation with pancreatic
enzymes.
2. Extensive colonic diverticulosis throughout the entire colon,
without colonic or diverticular inflammation.
3. Incidental findings include aneurysmal dilatation of the left
common iliac artery of 3.3 cm, no aortic aneurysm. Correlation with
any prior imaging would be most helpful to confirm stability.
4. Right ovarian cyst measures 2.4 cm, abnormal in a postmenopausal
patient. Again, correlation with any prior imaging would be most
helpful. In the absence of prior imaging, nonemergent pelvic
ultrasound characterization recommended.
5. Gallstones versus gallbladder sludge, no pericholecystic
inflammation.

## 2021-12-02 DEATH — deceased
